# Patient Record
Sex: Female | Born: 1975 | Race: White | Hispanic: No | Marital: Married | Smoking: Never smoker
Health system: Southern US, Community
[De-identification: ages and names within clinical notes are randomized; demographics above are authoritative.]

## PROBLEM LIST (undated history)

## (undated) HISTORY — PX: ABDOMINAL HYSTERECTOMY: SHX81

## (undated) HISTORY — PX: AUGMENTATION MAMMAPLASTY: SUR837

## (undated) HISTORY — PX: CHOLECYSTECTOMY: SHX55

---

## 2001-01-03 ENCOUNTER — Encounter: Admission: RE | Admit: 2001-01-03 | Discharge: 2001-01-03 | Payer: Self-pay | Admitting: *Deleted

## 2001-01-03 ENCOUNTER — Encounter: Payer: Self-pay | Admitting: *Deleted

## 2001-08-01 ENCOUNTER — Inpatient Hospital Stay (HOSPITAL_COMMUNITY): Admission: AD | Admit: 2001-08-01 | Discharge: 2001-08-01 | Payer: Self-pay | Admitting: Obstetrics and Gynecology

## 2001-08-01 ENCOUNTER — Encounter: Payer: Self-pay | Admitting: Obstetrics and Gynecology

## 2001-12-13 ENCOUNTER — Inpatient Hospital Stay (HOSPITAL_COMMUNITY): Admission: AD | Admit: 2001-12-13 | Discharge: 2001-12-13 | Payer: Self-pay | Admitting: Obstetrics and Gynecology

## 2001-12-24 ENCOUNTER — Encounter: Payer: Self-pay | Admitting: Obstetrics and Gynecology

## 2001-12-24 ENCOUNTER — Inpatient Hospital Stay (HOSPITAL_COMMUNITY): Admission: AD | Admit: 2001-12-24 | Discharge: 2001-12-24 | Payer: Self-pay | Admitting: Obstetrics and Gynecology

## 2001-12-27 ENCOUNTER — Inpatient Hospital Stay (HOSPITAL_COMMUNITY): Admission: AD | Admit: 2001-12-27 | Discharge: 2001-12-27 | Payer: Self-pay | Admitting: Obstetrics & Gynecology

## 2001-12-29 ENCOUNTER — Observation Stay (HOSPITAL_COMMUNITY): Admission: AD | Admit: 2001-12-29 | Discharge: 2001-12-30 | Payer: Self-pay | Admitting: *Deleted

## 2002-01-09 ENCOUNTER — Inpatient Hospital Stay (HOSPITAL_COMMUNITY): Admission: AD | Admit: 2002-01-09 | Discharge: 2002-01-09 | Payer: Self-pay | Admitting: Obstetrics and Gynecology

## 2002-01-22 ENCOUNTER — Inpatient Hospital Stay (HOSPITAL_COMMUNITY): Admission: AD | Admit: 2002-01-22 | Discharge: 2002-01-22 | Payer: Self-pay | Admitting: *Deleted

## 2002-01-24 ENCOUNTER — Encounter: Payer: Self-pay | Admitting: Obstetrics & Gynecology

## 2002-01-24 ENCOUNTER — Encounter (INDEPENDENT_AMBULATORY_CARE_PROVIDER_SITE_OTHER): Payer: Self-pay

## 2002-01-24 ENCOUNTER — Inpatient Hospital Stay (HOSPITAL_COMMUNITY): Admission: AD | Admit: 2002-01-24 | Discharge: 2002-01-29 | Payer: Self-pay | Admitting: Obstetrics & Gynecology

## 2002-03-12 ENCOUNTER — Other Ambulatory Visit: Admission: RE | Admit: 2002-03-12 | Discharge: 2002-03-12 | Payer: Self-pay | Admitting: *Deleted

## 2005-02-25 ENCOUNTER — Other Ambulatory Visit: Admission: RE | Admit: 2005-02-25 | Discharge: 2005-02-25 | Payer: Self-pay | Admitting: Obstetrics and Gynecology

## 2005-03-16 ENCOUNTER — Encounter: Admission: RE | Admit: 2005-03-16 | Discharge: 2005-03-16 | Payer: Self-pay | Admitting: Obstetrics and Gynecology

## 2008-07-23 ENCOUNTER — Observation Stay (HOSPITAL_COMMUNITY): Admission: EM | Admit: 2008-07-23 | Discharge: 2008-07-24 | Payer: Self-pay | Admitting: Emergency Medicine

## 2010-07-08 LAB — CROSSMATCH
ABO/RH(D): O POS
Antibody Screen: NEGATIVE

## 2010-07-08 LAB — HEMOGLOBIN AND HEMATOCRIT, BLOOD
HCT: 26.1 % — ABNORMAL LOW (ref 36.0–46.0)
Hemoglobin: 9.1 g/dL — ABNORMAL LOW (ref 12.0–15.0)

## 2010-07-08 LAB — CBC
HCT: 32 % — ABNORMAL LOW (ref 36.0–46.0)
Hemoglobin: 11.3 g/dL — ABNORMAL LOW (ref 12.0–15.0)
MCV: 94.8 fL (ref 78.0–100.0)
RBC: 3.38 MIL/uL — ABNORMAL LOW (ref 3.87–5.11)
WBC: 19 10*3/uL — ABNORMAL HIGH (ref 4.0–10.5)

## 2010-07-08 LAB — ABO/RH: ABO/RH(D): O POS

## 2010-08-11 NOTE — Op Note (Signed)
Courtney Roberts, Courtney Roberts              ACCOUNT NO.:  [id number]   MEDICAL RECORD NO.:  [id number]          PATIENT TYPE:  INP   LOCATION:  2550                         FACILITY:  [facility]   PHYSICIAN:  Consuello Bossier., M.D.DATE OF BIRTH:  1976-02-06   DATE OF PROCEDURE:  07/23/2008  DATE OF DISCHARGE:                               OPERATIVE REPORT   PREOPERATIVE DIAGNOSIS:  Hematoma following abdominoplasty.   POSTOPERATIVE DIAGNOSIS:  Hematoma following abdominoplasty.   OPERATIONS PERFORMED:  Evacuation of hematoma, achieving of hemostasis,  and replacement of drains.   SURGEON:  Pleas Patricia, MD   ASSISTANT:  Etter Sjogren, MD   ANESTHESIA:  General endotracheal.   FINDINGS:  The patient has had an abdominoplasty and a concomitant  bilateral augmentation mammoplasty today.  Approximately 8 hours after  surgery, it had been noticed and have some increased drainage and what  appeared to be enlarging area of her abdominal area, which was felt to  be a hematoma.  Then, it was felt advisable to return to the operating  room for evacuation and achieving hemostasis.  The above surgical  procedure was carried out.   PROCEDURE:  The patient was brought to the operating room, given a  general endotracheal anesthetic, prepped with Betadine and about abdomen  draped in the sterile fashion.  Her drains had been removed, and a Foley  catheter had been placed.  The previous transverse lower abdominal  incision was opened and what appeared to be around 300-400 mL of old  clotted blood was evacuated.  It was  one small but active arterial  bleeder in the left midabdominal area for which hemostasis was achieved  with electrocautery and appeared with several other smaller vessels,  which had some oozing but nothing significantly active other than the  one small arterial bleeding.  The wound was irrigated with copious  amounts of normal saline.  There was noted to be good hemostasis.   Two  10-mm Blake drains were placed through the same previous incisions and  then laid along each side of the umbilicus.  The wound was then closed  with some cutaneous #3-0 Monocryl followed by running subcuticular #4-0  Monocryl.  Steri-Strips, Xeroform, and 4 x 8's held in place by an  abdominal binder and were applied.  The patient tolerated the procedure  well, was able to be discharged from the operating room to recovery  room, and subsequently to be admitted for overnight observation.      Consuello Bossier., M.D.  Electronically Signed     HH/MEDQ  D:  07/23/2008  T:  07/24/2008  Job:  [id number]

## 2010-08-11 NOTE — H&P (Signed)
NAME:  Courtney Roberts, Courtney Roberts              ACCOUNT NO.:  [id number]   MEDICAL RECORD NO.:  [id number]          PATIENT TYPE:  INP   LOCATION:  2550                         FACILITY:  [facility]   PHYSICIAN:  Consuello Bossier., M.D.DATE OF BIRTH:  19-Sep-1975   DATE OF ADMISSION:  07/23/2008  DATE OF DISCHARGE:                              HISTORY & PHYSICAL   HISTORY OF PRESENT ILLNESS:  This is 35 year old female underwent an  uncomplicated bilateral breast augmentation and abdominoplasty this a.m.  and approximately 8 hours or so postoperatively, she was noted to have a  tender enlarged abdominal area and what appeared to be excess drainage  in her drain tubes and it was felt that she had a hematoma that need to  be evacuated.  She was admitted for this procedure.   Past medical history and review of systems are noncontributory other  than noted above.   PERTINENT PHYSICAL EXAMINATION:  GENERAL:  She had a dressing about her  chest from her augmentation mammoplasty, which had been performed  concomitantly.  Her abdominal area was swollen as well and she had what  appeared to be some bright red blood still coming from her drain sites  entered 2 drains under vacuum suction.  HEENT:  Negative.  PULMONARY:  Negative.  CARDIAC:  Negative.  GI:  Negative.  GU:  Negative.  VITAL SIGNS:  Temperature 98.6, blood pressure 91/54, pulse 72 and  regular, and respirations 15.   HOSPITAL COURSE:  The patient and her family had been advised about the  necessity of exploring this wound, evacuating what appear to be  clinically a hematoma, and obtaining hemostasis and was admitted for  this procedure.      Consuello Bossier., M.D.  Electronically Signed     HH/MEDQ  D:  07/23/2008  T:  07/24/2008  Job:  [id number]

## 2010-08-14 NOTE — Discharge Summary (Signed)
Courtney Roberts, [name]                        ACCOUNT NO.:  [id number]   MEDICAL RECORD NO.:  [id number]                   PATIENT TYPE:  INP   LOCATION:  9105                                 FACILITY:  [facility]   PHYSICIAN:  Courtney Roberts, M.D.                DATE OF BIRTH:  Jun 11, 1975   DATE OF ADMISSION:  01/24/2002  DATE OF DISCHARGE:  01/29/2002                                 DISCHARGE SUMMARY   ADMISSION DIAGNOSES:  1. Intrauterine pregnancy at 36-4/7 weeks' gestational age with a     nonreassuring nonstress test.  2. Vasoprevia.   DISCHARGE DIAGNOSES:  1. Status post low transverse cesarean section.  2. Viable female infant.  3. Permanent sterilization.  4. Status post total abdominal hysterectomy.   PROCEDURE:  1. Repeat low transverse cesarean section.  2. Bilateral tubal ligation.  3. Lysis of adhesions.  4. Total abdominal hysterectomy.   REASON FOR ADMISSION:  Please see dictated H&P.   HOSPITAL COURSE:  The patient is a 35 year old white married female, gravida  4, para that was admitted to [facility] for a biophysical  profile after observing in office fetal monitoring strip which revealed  deceleration pattern.  Ultrasound at [facility] revealed a  placenta had subserrate lobe with vessels crossing the cervix.  The cervix  only measured 1.7 cm in length with intermittent funneling at the internal  os.  Decision was made to proceed with a repeat cesarean delivery.  The  patient was taken to the operating room where spinal anesthesia was  administered without difficulty.  A low transverse incision was made with  the delivery of a viable female infant weighing 6 pounds 14 ounces with Apgars  of 9 at one minute and 9 at five minutes.  Umbilical cord pH was 7.38.  There was great difficulty in freeing the placenta completely from the right  side of the uterus but after careful exploration it was felt that the  placenta had been  essentially removed.  Bilateral tubal ligation was  performed with lysis of adhesions.  The patient tolerated the procedure well  and was taken to the recovery room in stable condition.  While in the  recovery room, the patient began to bleed excessively.  Hemabate was given  x2.  Intravenous Pitocin was administered, but the patient had significant  continued vaginal bleeding.  Uterus was temporarily firm with massage but  bright red bleeding continued.  Based on knowledge of intraoperative  findings with placenta, decision was made to proceed with a total abdominal  hysterectomy.  The patient was taken to the operating room where general  anesthesia was administered.  A low transverse incision was undertaken  intraoperatively, and the patient received four units of packed red blood  cells.  Steri-Strips and staples were removed.  The incision was  progressively opened.  Hysterectomy was performed.  Bleeding was resolved.  The patient  tolerated the procedure well and was awakened in the operating  room and transferred to the recovery room in stable condition.  On  postoperative day #1, vital signs were stable.  Urine output was good.  Abdomen was soft.  Abdominal dressing was clean, dry and intact.  Hemoglobin  was 10.9 (status post six units of packed red blood cells).  The antibiotics  were given prophylactically.  On postoperative day #2, vital signs were  stable.  Abdomen was soft with good return of bowel function.  The  antibiotics were continued.  She was tolerating a clear liquid diet without  complaints of nausea or vomiting.  Labs revealed hemoglobin of 8.0.  Platelet count 86,000.  WBC count 20.6.  On postoperative day #3,  temperature was noted to 100.2 degrees Farenheit.  Other vital signs were  stable.  Abdomen was soft.  Abdominal dressing was removed, and the incision  was noted to be clean, dry and intact.  The labs revealed a hemoglobin of  7.0, platelet count 162,000 and  WBC count of 17.2.  On postoperative day #4,  the patient was feeling better.  Hemoglobin was up to 7.4.  She was  ambulating without complaints of dizziness.  She was tolerating a regular  diet.  Intravenous antibiotics were discontinued, and antibiotics were  changed to oral administration.  On postoperative day #5, the patient was  doing well.  Hemoglobin was 7.4.  Incision was clean, dry and intact.  Staples were removed, and the patient was discharged home.   DISCHARGE CONDITION:  Stable.   DIET:  Regular as tolerated.   ACTIVITY:  No heavy lifting, no vaginal entry.  No driving x 2 weeks.   FOLLOWUP:  The patient is to follow up in the office in one week.  She is to  call for temperature greater than 100 degrees, persistent nausea and  vomiting, heavy vaginal bleeding, and redness or drainage from the  incisional site.   DISCHARGE MEDICATIONS:  1. Percocet 5/325 mg #30 one p.o. every four to six hours p.r.n. pain.  2. Motrin 600 mg every six hours p.r.n.  3. Niferex 150 mg one p.o. daily.  4. Augmentin 875 mg one p.o. b.i.d. x 7 days.  5. Colace one p.o. daily p.r.n.     Courtney Roberts, N.P.                        Courtney Roberts, M.D.    CC/MEDQ  D:  02/18/2002  T:  02/18/2002  Job:  [id number]

## 2010-08-14 NOTE — Op Note (Signed)
NAME:  Courtney Roberts, Courtney Roberts NO.:  [id number]   MEDICAL RECORD NO.:  [id number]                   PATIENT TYPE:  INP   LOCATION:  9198                                 FACILITY:  [facility]   PHYSICIAN:  Freddy Finner, M.D.                DATE OF BIRTH:  [birth year]   DATE OF PROCEDURE:  01/24/2002  DATE OF DISCHARGE:                                 OPERATIVE REPORT   PREOPERATIVE DIAGNOSES:  1. Intrauterine pregnancy, 36-4/[redacted] weeks gestation.  2. Nonreassuring fetal heart tracing with intermittent variable     decelerations.  3. Ultrasound showing succenturiate, labyrinthine placenta with vasa previa.   POSTOPERATIVE DIAGNOSES:  1. Intrauterine pregnancy, 36-4/[redacted] weeks gestation.  2. Nonreassuring fetal heart tracing with intermittent variable     decelerations.  3. Ultrasound showing succenturiate, labyrinthine placenta with vasa previa.  4. Apparent absence of right rectus muscle.  5. Marked attenuation of the lower uterine segment.  6. Obliteration of the anterior cul-de-sac with extensive adhesions and     adhesion of omentum to anterior fundal surface.   SECONDARY DIAGNOSIS:  Multiparity, requests surgical sterilization.   OPERATION PERFORMED:  Repeat cesarean section with delivery of a viable female  infant, Apgar's of 9 and 9, cord pH of 7.38.   SECONDARY OPERATION PERFORMED:  1. Lysis of adhesions.  2. Bilateral tubal ligation.   SURGEON:  Freddy Finner, M.D.   ANESTHESIA:  Spinal.   ESTIMATED INTRAOPERATIVE BLOOD LOSS:  One-thousand cc.   INTRAOPERATIVE COMPLICATIONS:  None.   BRIEF HISTORY:  The patient was seen in the office today where a fetal  monitor strip showed decelerations.  She was sent directly to the hospital  where an ultrasound was obtained for biophysical profile.  By verbal report  the placenta was felt to have a succenturiate lobe with vessels crossing the  cervix.  The cervix was said to be dynamic measuring only 1.7 cm in  length  with intermittent funneling at the internal os.  It was elected to proceed  with repeat cesarean delivery after these findings.   DESCRIPTION OF OPERATION:  The patient was brought to the operating room,  placed under spinal anesthesia and placed in the dorsal recumbent position  with elevation of the right hip of 15 degrees.  Abdomen was prepped in the  usual fashion.  Foley cath was placed using sterile technique.  Sterile  drapes were applied.  An old lower abdominal transverse scar was sharply  excised.  The incision was carried sharply down to fascia, which was entered  sharply down to fascia, which was entered sharply and extended to the extent  of the skin incision.  On entry of the fascia the lower uterine segment  bulged into the incision.  The bladder flap was indistinguishable because of  adhesions.  The bladder appeared to be well below the thin lower uterine  segment, which was entered sharply.  It measured only approximately 2 mm in  thickness.  The incision was extended bluntly in a transverse direction.  The membranes were ruptured, fluid was clear.  A viable female was then  delivered without difficulty.   Arterial cord blood was obtained for gases.  Routine venous sample was  obtained for examination.   Placenta and other products of conception were manually removed from the  uterus.  There was great difficulty in freeing the placenta completely to  the right side of the uterus superior and inferior to the incision, but  after careful exploration it was felt that the placenta had been essentially  completely removed.  With careful sharp and blunt dissection the adhesions  anteriorly were freed.  A segment of omentum was sharply excised from the  anterior fundal surface.  It was free-tied with 0-plain tie and a small  segment of omentum removed.  This created adequate hemostasis.  After  dissecting the adhesions the uterus could be delivered through the  abdominal  incision and this was done.  The posterior fundal surface appeared to be  normal as did the superior surface.  The tubes and ovaries were normal.  The  midportion of each tube was elevated with a Babcock, doubly ligated with  free ties of 0-plain and a segment of tube excised on each side, and the  distal tips of the tubes fulgurated.  This was done after closure of the  lower uterine segment incision with a running locking 0-Monocryl suture.  There was a bleeding source on the thin lower segment to the right and  superior to the incision, which was figure-of-eight with 0-Monocryl. The  defect on the anterior fundal surface created by removing the omental  adhesion was also closed with a figure-of-eight of 0-Monocryl.   At this point hemostasis seemed to be adequate.  The uterus was placed back  within the abdominal cavity.  The abdominal closure was carried out in  layers.  There was no obvious peritoneal layer, which was separate, but  there was thin tissue consistent with posterior sheath or posterior sheath  fused with peritoneum that was used and appropriated to the left rectus  muscle for closure of the abdominal cavity.  The fascia was then closed with  running 0-PDS from angle-to-angle on either side.  The subcutaneous tissue  was closed with running 2-0 plain suture.  Skin was closed with wide skin  staples and covered with Steri-Strips.   The patient tolerated the operative procedure well.   Appropriate pack, needle and instrument counts were achieved.   She was taken to recovery in good condition.                                                 Freddy Finner, M.D.    WRN/MEDQ  D:  01/24/2002  T:  01/24/2002  Job:  [id number]

## 2010-08-14 NOTE — Op Note (Signed)
NAME:  Courtney Roberts, Courtney Roberts NO.:  [id number]   MEDICAL RECORD NO.:  [id number]                   PATIENT TYPE:  INP   LOCATION:  9198                                 FACILITY:  [facility]   PHYSICIAN:  Freddy Finner, M.D.                DATE OF BIRTH:  08-26-1975   DATE OF PROCEDURE:  01/24/2002  DATE OF DISCHARGE:                                 OPERATIVE REPORT   PREOPERATIVE DIAGNOSIS:  Postpartum hemorrhage following cesarean delivery;  this is presumed to have occurred from the site on the right noted in the  first operative note where the placenta appeared to be densely adherent to  the lower uterine segment which was extremely attenuated.   OPERATIVE PROCEDURE:  Total abdominal hysterectomy.   SURGEON:  Freddy Finner, M.D.   ANESTHESIA:  General endotracheal.   ESTIMATED BLOOD LOSS:  Estimated intraoperative blood loss during this  portion of the procedure was approximately 1000 cc.  The patient had also  probably lost another 1000 cc before beginning the procedure.  Blood  transfusion was undertaken intraoperatively.  The patient was to receive 4  units of packed red blood cells.   INDICATION FOR PROCEDURE:  The patient was initially in good postoperative  condition following her cesarean but approximately 30 minutes into her  postoperative course she had significant vaginal bleeding.  She was attended  by  Dr. Henderson Cloud in my short-term absence from the hospital and he gave her  Hemabate x2.  She had rapid infusion of IV Pitocin but continued to have  significant vaginal bleeding.  She would temporarily firm up in the recovery  room but there was a continued at least light bright red lochia consistent  with significant bleeding.  Based on the known intraoperative findings from  the previous procedure, it was elected to proceed with total abdominal  hysterectomy.   DESCRIPTION OF PROCEDURE:  The patient was brought back to the operating  room,  placed under adequate general endotracheal anesthesia, the abdomen was  prepped, the Steri-Strips and staples were removed, the incision was  progressively opened cutting the sutures rows.  On entry into the abdomen  the uterus was elevated through the abdominal incision and the dissection  was begun.  The proximal broad ligaments were clamped with large Kellys  using curved Heaneys initially, progressive pedicles were developed to  control the utero-ovarian pedicle.  A total of three pedicles were required  on each side for control of the upper broad ligament.  The dissection was  progressively taken along the side of the cervix which was palpated  regularly using straight or curved Heaneys, supportive ligament pedicles  were developed, uterine artery pedicles were developed.  Once the vessels  had been controlled on either side and after careful blunt dissection of the  bladder off the lower segment of the cervix the uterus was amputated.  The  cervix could  then be palpated digitally and with additional pedicles on each  side the remaining cardinal ligament portion of the broad ligament was  taken, each of the pedicles were divided and ligated with suture ligatures  with 0 Monocryl or 0 Vicryl.  When the angle of vagina had been reached it  was cross clamped with a curved Heaney and the cervix excised from the  uterosacral, each of these pedicles was transfixed with a suture ligature of  0 Monocryl in a Haney fashion and held, the cervix was then carefully  dissected free of the vaginal cuff.  The cuff was then closed vertically  with figure-of-eights with 0 Vicryl.  The uterosacrals were plicated with 0  Vicryl.  Copious irrigation was carried out.  Hemostasis was noted to be  complete throughout.  All packs, needles, and instruments were removed.  Counts were correct.  The abdominal incision was again closed in layers.  The attenuated peritoneum and posterior fascia was used and closed  to the  left rectus muscle.  The fascia was closed with running 0 PDS, subcutaneous  tissue was closed with running 0 plain suture, the skin was closed with  ______ skin staples and cornered Steri-Strips.  The patient tolerated the  operative procedure well, was awakened and taken to the recovery room in  satisfactory condition.                                               Freddy Finner, M.D.    WRN/MEDQ  D:  01/24/2002  T:  01/24/2002  Job:  [id number]

## 2013-07-30 ENCOUNTER — Emergency Department (HOSPITAL_COMMUNITY)
Admission: EM | Admit: 2013-07-30 | Discharge: 2013-07-31 | Disposition: A | Discharge status: Home / Self Care | Payer: 59 | Attending: Emergency Medicine | Admitting: Emergency Medicine

## 2013-07-30 ENCOUNTER — Encounter (HOSPITAL_COMMUNITY): Payer: Self-pay | Admitting: Emergency Medicine

## 2013-07-30 ENCOUNTER — Emergency Department (HOSPITAL_COMMUNITY)
Admission: EM | Admit: 2013-07-30 | Discharge: 2013-07-30 | Disposition: A | Discharge status: Home / Self Care | Payer: 59 | Source: Home / Self Care

## 2013-07-30 ENCOUNTER — Emergency Department (HOSPITAL_COMMUNITY): Payer: 59

## 2013-07-30 DIAGNOSIS — Z90710 Acquired absence of both cervix and uterus: Secondary | ICD-10-CM | POA: Diagnosis not present

## 2013-07-30 DIAGNOSIS — R110 Nausea: Secondary | ICD-10-CM

## 2013-07-30 DIAGNOSIS — K5289 Other specified noninfective gastroenteritis and colitis: Secondary | ICD-10-CM | POA: Insufficient documentation

## 2013-07-30 DIAGNOSIS — R1031 Right lower quadrant pain: Secondary | ICD-10-CM | POA: Diagnosis present

## 2013-07-30 DIAGNOSIS — Z9089 Acquired absence of other organs: Secondary | ICD-10-CM | POA: Diagnosis not present

## 2013-07-30 DIAGNOSIS — R109 Unspecified abdominal pain: Secondary | ICD-10-CM

## 2013-07-30 DIAGNOSIS — K529 Noninfective gastroenteritis and colitis, unspecified: Secondary | ICD-10-CM

## 2013-07-30 LAB — CBC WITH DIFFERENTIAL/PLATELET
BASOS ABS: 0 10*3/uL (ref 0.0–0.1)
Basophils Relative: 0 % (ref 0–1)
EOS PCT: 1 % (ref 0–5)
Eosinophils Absolute: 0.1 10*3/uL (ref 0.0–0.7)
HCT: 40.9 % (ref 36.0–46.0)
Hemoglobin: 14.1 g/dL (ref 12.0–15.0)
LYMPHS ABS: 4.1 10*3/uL — AB (ref 0.7–4.0)
LYMPHS PCT: 40 % (ref 12–46)
MCH: 33.8 pg (ref 26.0–34.0)
MCHC: 34.5 g/dL (ref 30.0–36.0)
MCV: 98.1 fL (ref 78.0–100.0)
Monocytes Absolute: 0.8 10*3/uL (ref 0.1–1.0)
Monocytes Relative: 8 % (ref 3–12)
NEUTROS ABS: 5.3 10*3/uL (ref 1.7–7.7)
Neutrophils Relative %: 51 % (ref 43–77)
PLATELETS: 213 10*3/uL (ref 150–400)
RBC: 4.17 MIL/uL (ref 3.87–5.11)
RDW: 13.5 % (ref 11.5–15.5)
WBC: 10.3 10*3/uL (ref 4.0–10.5)

## 2013-07-30 LAB — COMPREHENSIVE METABOLIC PANEL
ALK PHOS: 75 U/L (ref 39–117)
ALT: 19 U/L (ref 0–35)
AST: 17 U/L (ref 0–37)
Albumin: 3.7 g/dL (ref 3.5–5.2)
BILIRUBIN TOTAL: 0.4 mg/dL (ref 0.3–1.2)
BUN: 14 mg/dL (ref 6–23)
CHLORIDE: 104 meq/L (ref 96–112)
CO2: 28 meq/L (ref 19–32)
Calcium: 9.4 mg/dL (ref 8.4–10.5)
Creatinine, Ser: 0.77 mg/dL (ref 0.50–1.10)
GLUCOSE: 104 mg/dL — AB (ref 70–99)
POTASSIUM: 3.9 meq/L (ref 3.7–5.3)
SODIUM: 143 meq/L (ref 137–147)
Total Protein: 6.7 g/dL (ref 6.0–8.3)

## 2013-07-30 LAB — URINALYSIS, ROUTINE W REFLEX MICROSCOPIC
Bilirubin Urine: NEGATIVE
GLUCOSE, UA: NEGATIVE mg/dL
HGB URINE DIPSTICK: NEGATIVE
Ketones, ur: 15 mg/dL — AB
LEUKOCYTES UA: NEGATIVE
Nitrite: NEGATIVE
PROTEIN: NEGATIVE mg/dL
SPECIFIC GRAVITY, URINE: 1.022 (ref 1.005–1.030)
UROBILINOGEN UA: 1 mg/dL (ref 0.0–1.0)
pH: 5.5 (ref 5.0–8.0)

## 2013-07-30 LAB — LIPASE, BLOOD: Lipase: 21 U/L (ref 11–59)

## 2013-07-30 MED ORDER — HYDROMORPHONE HCL PF 1 MG/ML IJ SOLN
1.0000 mg | Freq: Once | INTRAMUSCULAR | Status: AC
  Administered 2013-07-30: 1 mg via INTRAVENOUS
  Filled 2013-07-30: qty 1

## 2013-07-30 MED ORDER — SODIUM CHLORIDE 0.9 % IV SOLN
1000.0000 mL | Freq: Once | INTRAVENOUS | Status: AC
  Administered 2013-07-30: 1000 mL via INTRAVENOUS

## 2013-07-30 MED ORDER — ONDANSETRON HCL 4 MG/2ML IJ SOLN
4.0000 mg | Freq: Once | INTRAMUSCULAR | Status: AC
  Administered 2013-07-30: 4 mg via INTRAVENOUS
  Filled 2013-07-30: qty 2

## 2013-07-30 MED ORDER — IOHEXOL 300 MG/ML  SOLN
100.0000 mL | Freq: Once | INTRAMUSCULAR | Status: AC | PRN
  Administered 2013-07-30: 100 mL via INTRAVENOUS

## 2013-07-30 MED ORDER — IOHEXOL 300 MG/ML  SOLN
50.0000 mL | Freq: Once | INTRAMUSCULAR | Status: AC | PRN
  Administered 2013-07-30: 50 mL via ORAL

## 2013-07-30 NOTE — ED Notes (Signed)
Pt complains of right lower abd pain since about 2pm today, no vomiting but very nauseated

## 2013-07-30 NOTE — ED Provider Notes (Signed)
CSN: [id number]     Arrival date & time 07/30/13  2115 History   First MD Initiated Contact with Patient 07/30/13 2206     Chief Complaint  Patient presents with  . Abdominal Pain     (Consider location/radiation/quality/duration/timing/severity/associated sxs/prior Treatment) HPI Courtney Roberts is a(n) 38 y.o. female who presents to the emergency department complaining of right lower quadrant abdominal pain. Patient states that her pain began this morning. She describes it as intermittent at that time. It is progressively worse and is now on stent, severe, she is associated severe nausea without vomiting. Patient denies any urinary symptoms patient history previous hysterectomy. She denies any vaginal symptoms. No fever, chills,diarrhea.   History reviewed. No pertinent past medical history. Past Surgical History  Procedure Laterality Date  . Abdominal hysterectomy    . Cholecystectomy     History reviewed. No pertinent family history. History  Substance Use Topics  . Smoking status: Never Smoker   . Smokeless tobacco: Not on file  . Alcohol Use: No   OB History   Grav Para Term Preterm Abortions TAB SAB Ect Mult Living                 Review of Systems  Ten systems reviewed and are negative for acute change, except as noted in the HPI.     Allergies  Review of patient's allergies indicates no known allergies.  Home Medications   Prior to Admission medications   Medication Sig Start Date End Date Taking? Authorizing Provider  ibuprofen (ADVIL,MOTRIN) 200 MG tablet Take 400 mg by mouth every 6 (six) hours as needed for moderate pain.   Yes Historical Provider, MD   BP 107/73  Pulse 74  Temp(Src) 98.3 F (36.8 C) (Oral)  Resp 16  SpO2 99% Physical Exam  Constitutional: She is oriented to person, place, and time. She appears well-developed and well-nourished. No distress.  HENT:  Head: Normocephalic and atraumatic.  Eyes: Conjunctivae are normal. No scleral  icterus.  Neck: Normal range of motion.  Cardiovascular: Normal rate, regular rhythm and normal heart sounds.  Exam reveals no gallop and no friction rub.   No murmur heard. Pulmonary/Chest: Effort normal and breath sounds normal. No respiratory distress.  Abdominal: Soft. Bowel sounds are normal. She exhibits no distension and no mass. There is tenderness in the right lower quadrant. There is rebound. There is no rigidity, no guarding and no CVA tenderness.  + rebound, + rovsing's, + obturator  Neurological: She is alert and oriented to person, place, and time.  Skin: Skin is warm and dry. She is not diaphoretic.    ED Course  Procedures (including critical care time) Labs Review Labs Reviewed  URINALYSIS, ROUTINE W REFLEX MICROSCOPIC - Abnormal; Notable for the following:    APPearance CLOUDY (*)    Ketones, ur 15 (*)    All other components within normal limits  CBC WITH DIFFERENTIAL - Abnormal; Notable for the following:    Lymphs Abs 4.1 (*)    All other components within normal limits  COMPREHENSIVE METABOLIC PANEL - Abnormal; Notable for the following:    Glucose, Bld 104 (*)    All other components within normal limits  LIPASE, BLOOD    Imaging Review Ct Abdomen Pelvis W Contrast  07/30/2013   CLINICAL DATA:  Right lower abdominal pain  EXAM: CT ABDOMEN AND PELVIS WITH CONTRAST  TECHNIQUE: Multidetector CT imaging of the abdomen and pelvis was performed using the standard protocol following bolus administration of  intravenous contrast.  CONTRAST:  51m OMNIPAQUE IOHEXOL 300 MG/ML SOLN, 1045mOMNIPAQUE IOHEXOL 300 MG/ML SOLN  COMPARISON:  None.  FINDINGS: The lung bases are clear.  The liver demonstrates no focal abnormality. There is no intrahepatic or extrahepatic biliary ductal dilatation. The gallbladder is surgically absent. The spleen demonstrates no focal abnormality. The kidneys, adrenal glands and pancreas are normal. The bladder is unremarkable.  The stomach, duodenum,  small intestine, and large intestine demonstrate no contrast extravasation or dilatation. There is mild bowel wall thickening of the proximal jejunum without bowel dilatation. Prior hysterectomy. The ovaries are unremarkable. There is no pneumoperitoneum, pneumatosis, or portal venous gas. There is no abdominal or pelvic free fluid. There is no lymphadenopathy. There is mild periportal edema.  The abdominal aorta is normal in caliber.  There are no lytic or sclerotic osseous lesions.  IMPRESSION: 1. Mild nonspecific periportal edema. 2. Mild jejunal bowel wall thickening as can be seen with enteritis secondary to an infectious or inflammatory process.   Electronically Signed   By: HeKathreen Devoid On: 07/30/2013 23:47     EKG Interpretation None      MDM   Final diagnoses:  Enteritis  Abdominal pain  Nausea   Patient wit Abdominal pain in the RLQ. Concern for appendicitis. I doubt other cause such as TOA/ Ovarian torsion. Patient is without pain at time of exam.     11:59 PM BP 107/73  Pulse 74  Temp(Src) 98.3 F (36.8 C) (Oral)  Resp 16  SpO2 99% Patient with jejunal wall thickening.  Appears to be enteritis.  Cipro, flagyl, and pain meds at discharge. Clear liquid diet and strong return precautions given.  AbMargarita MailPA-C 07/31/13 0025

## 2013-07-30 NOTE — ED Notes (Addendum)
Presents with right lower abdominal pain began at 2-3pm this afternoon and has progressively worsened. Pain is worse with urination, nothing makes pain better. Last meal at 7 pm tonight. Pain rated 8/10. Denies nausea and vomiting.  Denies vaginal discharge, dneis dysuria.

## 2013-07-31 MED ORDER — HYDROMORPHONE HCL PF 1 MG/ML IJ SOLN
1.0000 mg | Freq: Once | INTRAMUSCULAR | Status: AC
  Administered 2013-07-31: 1 mg via INTRAVENOUS
  Filled 2013-07-31: qty 1

## 2013-07-31 MED ORDER — ONDANSETRON HCL 4 MG/2ML IJ SOLN
4.0000 mg | Freq: Once | INTRAMUSCULAR | Status: AC
  Administered 2013-07-31: 4 mg via INTRAVENOUS
  Filled 2013-07-31: qty 2

## 2013-07-31 MED ORDER — CIPROFLOXACIN HCL 500 MG PO TABS: 500.0000 mg | ORAL_TABLET | Freq: Two times a day (BID) | ORAL | Status: AC

## 2013-07-31 MED ORDER — ONDANSETRON HCL 4 MG PO TABS: 4.0000 mg | ORAL_TABLET | Freq: Four times a day (QID) | ORAL | Status: AC

## 2013-07-31 MED ORDER — OXYCODONE-ACETAMINOPHEN 5-325 MG PO TABS: 1.0000 | ORAL_TABLET | Freq: Four times a day (QID) | ORAL | Status: AC | PRN

## 2013-07-31 MED ORDER — METRONIDAZOLE 500 MG PO TABS: 500.0000 mg | ORAL_TABLET | Freq: Two times a day (BID) | ORAL | Status: AC

## 2013-07-31 NOTE — Discharge Instructions (Signed)
Abdominal (belly) pain can be caused by many things. Your caregiver performed an examination and possibly ordered blood/urine tests and imaging (CT scan, x-rays, ultrasound). Many cases can be observed and treated at home after initial evaluation in the emergency department. Even though you are being discharged home, abdominal pain can be unpredictable. Therefore, you need a repeated exam if your pain does not resolve, returns, or worsens. Most patients with abdominal pain don't have to be admitted to the hospital or have surgery, but serious problems like appendicitis and gallbladder attacks can start out as nonspecific pain. Many abdominal conditions cannot be diagnosed in one visit, so follow-up evaluations are very important. SEEK IMMEDIATE MEDICAL ATTENTION IF: The pain does not go away or becomes severe.  A temperature above 101 develops.  Repeated vomiting occurs (multiple episodes).  The pain becomes localized to portions of the abdomen. The right side could possibly be appendicitis. In an adult, the left lower portion of the abdomen could be colitis or diverticulitis.  Blood is being passed in stools or vomit (bright red or black tarry stools).  Return also if you develop chest pain, difficulty breathing, dizziness or fainting, or become confused, poorly responsive, or inconsolable (young children).   Colitis Colitis is inflammation of the colon. Colitis can be a short-term or long-standing (chronic) illness. Crohn's disease and ulcerative colitis are 2 types of colitis which are chronic. They usually require lifelong treatment. CAUSES  There are many different causes of colitis, including:  Viruses.  Germs (bacteria).  Medicine reactions. SYMPTOMS   Diarrhea.  Intestinal bleeding.  Pain.  Fever.  Throwing up (vomiting).  Tiredness (fatigue).  Weight loss.  Bowel blockage. DIAGNOSIS  The diagnosis of colitis is based on examination and stool or blood tests. X-rays, CT  scan, and colonoscopy may also be needed. TREATMENT  Treatment may include:  Fluids given through the vein (intravenously).  Bowel rest (nothing to eat or drink for a period of time).  Medicine for pain and diarrhea.  Medicines (antibiotics) that kill germs.  Cortisone medicines.  Surgery. HOME CARE INSTRUCTIONS   Get plenty of rest.  Drink enough water and fluids to keep your urine clear or pale yellow.  Eat a well-balanced diet.  Call your caregiver for follow-up as recommended. SEEK IMMEDIATE MEDICAL CARE IF:   You develop chills.  You have an oral temperature above 102 F (38.9 C), not controlled by medicine.  You have extreme weakness, fainting, or dehydration.  You have repeated vomiting.  You develop severe belly (abdominal) pain or are passing bloody or tarry stools. MAKE SURE YOU:   Understand these instructions.  Will watch your condition.  Will get help right away if you are not doing well or get worse. Document Released: 04/22/2004 Document Revised: 06/07/2011 Document Reviewed: 07/18/2009 [facility] Patient Information 2014 [place], [place].   Nausea, Adult Nausea is the feeling that you have an upset stomach or have to vomit. Nausea by itself is not likely a serious concern, but it may be an early sign of more serious medical problems. As nausea gets worse, it can lead to vomiting. If vomiting develops, there is the risk of dehydration.  CAUSES   Viral infections.  Food poisoning.  Medicines.  Pregnancy.  Motion sickness.  Migraine headaches.  Emotional distress.  Severe pain from any source.  Alcohol intoxication. HOME CARE INSTRUCTIONS  Get plenty of rest.  Ask your caregiver about specific rehydration instructions.  Eat small amounts of food and sip liquids more often.  Take all medicines as told by your caregiver. SEEK MEDICAL CARE IF:  You have not improved after 2 days, or you get worse.  You have a headache. SEEK  IMMEDIATE MEDICAL CARE IF:   You have a fever.  You faint.  You keep vomiting or have blood in your vomit.  You are extremely weak or dehydrated.  You have dark or bloody stools.  You have severe chest or abdominal pain. MAKE SURE YOU:  Understand these instructions.  Will watch your condition.  Will get help right away if you are not doing well or get worse. Document Released: 04/22/2004 Document Revised: 12/08/2011 Document Reviewed: 11/25/2010 [facility] Patient Information 2014 [place], [place]. Diet The clear liquid diet consists of foods that are liquid or will become liquid at room temperature. Examples of foods allowed on a clear liquid diet include fruit juice, broth or bouillon, gelatin, or frozen ice pops. You should be able to see through the liquid. The purpose of this diet is to provide the necessary fluids, electrolytes (such as sodium and potassium), and energy to keep the body functioning during times when you are not able to consume a regular diet. A clear liquid diet should not be continued for long periods of time, as it is not nutritionally adequate.  A CLEAR LIQUID DIET MAY BE NEEDED:  When a sudden-onset (acute) condition occurs before or after surgery.   As the first step in oral feeding.   For fluid and electrolyte replacement in diarrheal diseases.   As a diet before certain medical tests are performed.  ADEQUACY The clear liquid diet is adequate only in ascorbic acid, according to the Recommended Dietary Allowances of the [employer].  CHOOSING FOODS Breads and Starches  Allowed: None are allowed.   Avoid: All are to be avoided.  Vegetables  Allowed: Strained vegetable juices.   Avoid: Any others.  Fruit  Allowed: Strained fruit juices and fruit drinks. Include 1 serving of citrus or vitamin C-enriched fruit juice daily.   Avoid: Any others.  Meat and Meat Substitutes  Allowed: None are allowed.    Avoid: All are to be avoided.  Milk Products  Allowed: None are allowed.   Avoid: All are to be avoided.  Soups and Combination Foods  Allowed: Clear bouillon, broth, or strained broth-based soups.   Avoid: Any others.  Desserts and Sweets  Allowed: Sugar, honey. High-protein gelatin. Flavored gelatin, ices, or frozen ice pops that do not contain milk.   Avoid: Any others.  Fats and Oils  Allowed: None are allowed.   Avoid: All are to be avoided.  Beverages  Allowed: Cereal beverages, coffee (regular or decaffeinated), tea, or soda at the discretion of your health care provider.   Avoid: Any others.  Condiments  Allowed: Salt.   Avoid: Any others, including pepper.  Supplements  Allowed: Liquid nutrition beverages that you can see through.   Avoid: Any others that contain lactose or fiber. SAMPLE MEAL PLAN Breakfast  4 oz (120 mL) strained orange juice.   to 1 cup (120 to 240 mL) gelatin (plain or fortified).  1 cup (240 mL) beverage (coffee or tea).  Sugar, if desired. Midmorning Snack   cup (120 mL) gelatin (plain or fortified). Lunch  1 cup (240 mL) broth or consomm.  4 oz (120 mL) strained grapefruit juice.   cup (120 mL) gelatin (plain or fortified).  1 cup (240 mL) beverage (coffee or tea).  Sugar, if desired. Midafternoon Snack   cup (  120 mL) fruit ice.   cup (120 mL) strained fruit juice. Dinner  1 cup (240 mL) broth or consomm.   cup (120 mL) cranberry juice.   cup (120 mL) flavored gelatin (plain or fortified).  1 cup (240 mL) beverage (coffee or tea).  Sugar, if desired. Evening Snack  4 oz (120 mL) strained apple juice (vitamin C-fortified).   cup (120 mL) flavored gelatin (plain or fortified). MAKE SURE YOU:  Understand these instructions.  Will watch your child's condition.  Will get help right away if your child is not doing well or gets worse. Document Released:  03/15/2005 Document Revised: 11/15/2012 Document Reviewed: 08/15/2012 [facility] Patient Information 2014 [place].

## 2013-08-01 NOTE — ED Provider Notes (Signed)
Medical screening examination/treatment/procedure(s) were performed by non-physician practitioner and as supervising physician I was immediately available for consultation/collaboration.   EKG Interpretation None        Merryl Hacker, MD 08/01/13 [phone]

## 2016-09-28 DIAGNOSIS — F4321 Adjustment disorder with depressed mood: Secondary | ICD-10-CM | POA: Diagnosis not present

## 2016-10-09 DIAGNOSIS — F4321 Adjustment disorder with depressed mood: Secondary | ICD-10-CM | POA: Diagnosis not present

## 2016-10-19 DIAGNOSIS — F4321 Adjustment disorder with depressed mood: Secondary | ICD-10-CM | POA: Diagnosis not present

## 2016-10-28 DIAGNOSIS — F4321 Adjustment disorder with depressed mood: Secondary | ICD-10-CM | POA: Diagnosis not present

## 2016-11-08 DIAGNOSIS — F4321 Adjustment disorder with depressed mood: Secondary | ICD-10-CM | POA: Diagnosis not present

## 2016-11-30 DIAGNOSIS — F4321 Adjustment disorder with depressed mood: Secondary | ICD-10-CM | POA: Diagnosis not present

## 2016-12-10 DIAGNOSIS — F4321 Adjustment disorder with depressed mood: Secondary | ICD-10-CM | POA: Diagnosis not present

## 2016-12-27 DIAGNOSIS — F4321 Adjustment disorder with depressed mood: Secondary | ICD-10-CM | POA: Diagnosis not present

## 2017-02-08 DIAGNOSIS — F4321 Adjustment disorder with depressed mood: Secondary | ICD-10-CM | POA: Diagnosis not present

## 2017-03-11 DIAGNOSIS — Z0000 Encounter for general adult medical examination without abnormal findings: Secondary | ICD-10-CM | POA: Diagnosis not present

## 2017-03-11 DIAGNOSIS — Z23 Encounter for immunization: Secondary | ICD-10-CM | POA: Diagnosis not present

## 2017-03-11 DIAGNOSIS — Z13220 Encounter for screening for lipoid disorders: Secondary | ICD-10-CM | POA: Diagnosis not present

## 2018-12-29 DIAGNOSIS — Z6825 Body mass index (BMI) 25.0-25.9, adult: Secondary | ICD-10-CM | POA: Diagnosis not present

## 2018-12-29 DIAGNOSIS — Z01419 Encounter for gynecological examination (general) (routine) without abnormal findings: Secondary | ICD-10-CM | POA: Diagnosis not present

## 2018-12-29 DIAGNOSIS — N951 Menopausal and female climacteric states: Secondary | ICD-10-CM | POA: Diagnosis not present

## 2018-12-29 DIAGNOSIS — Z1231 Encounter for screening mammogram for malignant neoplasm of breast: Secondary | ICD-10-CM | POA: Diagnosis not present

## 2019-06-28 ENCOUNTER — Ambulatory Visit: Payer: Self-pay | Attending: Internal Medicine

## 2019-06-28 DIAGNOSIS — Z23 Encounter for immunization: Secondary | ICD-10-CM

## 2019-06-28 NOTE — Progress Notes (Signed)
   Covid-19 Vaccination Clinic  Name:  Gorgeous Newlun    MRN: [id number] DOB: 12/05/1975  06/28/2019  Ms. Guttman was observed post Covid-19 immunization for 15 minutes without incident. She was provided with Vaccine Information Sheet and instruction to access the V-Safe system.   Ms. Dearman was instructed to call 911 with any severe reactions post vaccine: Marland Kitchen Difficulty breathing  . Swelling of face and throat  . A fast heartbeat  . A bad rash all over body  . Dizziness and weakness   Immunizations Administered    Name Date Dose VIS Date Route   Pfizer COVID-19 Vaccine 06/28/2019  2:20 PM 0.3 mL 03/09/2019 Intramuscular   Manufacturer: [place]   Lot: [id number]   [place]: [id number]

## 2019-07-23 ENCOUNTER — Ambulatory Visit: Payer: Self-pay | Attending: Internal Medicine

## 2019-07-23 DIAGNOSIS — Z23 Encounter for immunization: Secondary | ICD-10-CM

## 2019-07-23 NOTE — Progress Notes (Signed)
   Covid-19 Vaccination Clinic  Name:  Xela Oregel    MRN: [id number] DOB: 1975-12-17  07/23/2019  Ms. Espericueta was observed post Covid-19 immunization for 15 minutes without incident. She was provided with Vaccine Information Sheet and instruction to access the V-Safe system.   Ms. Caillier was instructed to call 911 with any severe reactions post vaccine: Marland Kitchen Difficulty breathing  . Swelling of face and throat  . A fast heartbeat  . A bad rash all over body  . Dizziness and weakness   Immunizations Administered    Name Date Dose VIS Date Route   Pfizer COVID-19 Vaccine 07/23/2019  2:35 PM 0.3 mL 05/23/2018 Intramuscular   Manufacturer: [place]   Lot: [id number]   [place]: [id number]

## 2020-05-16 ENCOUNTER — Ambulatory Visit: Payer: Self-pay | Attending: Internal Medicine

## 2020-05-16 ENCOUNTER — Other Ambulatory Visit (HOSPITAL_COMMUNITY): Payer: Self-pay | Admitting: Internal Medicine

## 2020-05-16 DIAGNOSIS — Z23 Encounter for immunization: Secondary | ICD-10-CM

## 2020-05-16 NOTE — Progress Notes (Signed)
   Covid-19 Vaccination Clinic  Name:  Courtney Roberts    MRN: [id number] DOB: 1975/06/27  05/16/2020  [name] was observed post Covid-19 immunization for 15 minutes without incident. She was provided with Vaccine Information Sheet and instruction to access the V-Safe system.   [name] was instructed to call 911 with any severe reactions post vaccine: Marland Kitchen Difficulty breathing  . Swelling of face and throat  . A fast heartbeat  . A bad rash all over body  . Dizziness and weakness   Immunizations Administered    Name Date Dose VIS Date Route   PFIZER Comrnaty(Gray TOP) Covid-19 Vaccine 05/16/2020  2:46 PM 0.3 mL 03/06/2020 Intramuscular   Manufacturer: [place]   Lot: [id number]   [place]: [phone]

## 2020-05-19 MED FILL — PFIZER-BIONT COVID-19 VAC-T: 30 | 21 days supply | Qty: 0 | Fill #0

## 2020-07-20 LAB — COLOGUARD: COLOGUARD: POSITIVE — AB

## 2020-07-23 ENCOUNTER — Other Ambulatory Visit: Payer: Self-pay

## 2020-10-06 ENCOUNTER — Other Ambulatory Visit: Payer: Self-pay

## 2021-01-20 ENCOUNTER — Other Ambulatory Visit (HOSPITAL_COMMUNITY): Payer: Self-pay

## 2021-04-01 DIAGNOSIS — R69 Illness, unspecified: Secondary | ICD-10-CM | POA: Diagnosis not present

## 2021-04-15 DIAGNOSIS — R69 Illness, unspecified: Secondary | ICD-10-CM | POA: Diagnosis not present

## 2021-05-21 DIAGNOSIS — L814 Other melanin hyperpigmentation: Secondary | ICD-10-CM | POA: Diagnosis not present

## 2021-05-21 DIAGNOSIS — L568 Other specified acute skin changes due to ultraviolet radiation: Secondary | ICD-10-CM | POA: Diagnosis not present

## 2021-05-21 DIAGNOSIS — L728 Other follicular cysts of the skin and subcutaneous tissue: Secondary | ICD-10-CM | POA: Diagnosis not present

## 2021-05-29 DIAGNOSIS — Z789 Other specified health status: Secondary | ICD-10-CM | POA: Diagnosis not present

## 2021-05-29 DIAGNOSIS — L720 Epidermal cyst: Secondary | ICD-10-CM | POA: Diagnosis not present

## 2021-07-16 DIAGNOSIS — L720 Epidermal cyst: Secondary | ICD-10-CM | POA: Diagnosis not present

## 2021-07-16 DIAGNOSIS — Z789 Other specified health status: Secondary | ICD-10-CM | POA: Diagnosis not present

## 2021-08-19 ENCOUNTER — Other Ambulatory Visit: Payer: Self-pay | Admitting: Family Medicine

## 2021-08-19 ENCOUNTER — Other Ambulatory Visit (HOSPITAL_COMMUNITY): Payer: Self-pay | Admitting: Family Medicine

## 2021-08-19 DIAGNOSIS — E7800 Pure hypercholesterolemia, unspecified: Secondary | ICD-10-CM

## 2021-09-01 ENCOUNTER — Ambulatory Visit (HOSPITAL_BASED_OUTPATIENT_CLINIC_OR_DEPARTMENT_OTHER)
Admission: RE | Admit: 2021-09-01 | Discharge: 2021-09-01 | Disposition: A | Discharge status: Home / Self Care | Payer: Self-pay | Source: Ambulatory Visit | Attending: Family Medicine | Admitting: Family Medicine

## 2021-09-01 DIAGNOSIS — E7800 Pure hypercholesterolemia, unspecified: Secondary | ICD-10-CM | POA: Insufficient documentation

## 2021-11-11 DIAGNOSIS — E7800 Pure hypercholesterolemia, unspecified: Secondary | ICD-10-CM | POA: Diagnosis not present

## 2022-01-19 DIAGNOSIS — Z1231 Encounter for screening mammogram for malignant neoplasm of breast: Secondary | ICD-10-CM | POA: Diagnosis not present

## 2022-01-19 DIAGNOSIS — Z6827 Body mass index (BMI) 27.0-27.9, adult: Secondary | ICD-10-CM | POA: Diagnosis not present

## 2022-01-19 DIAGNOSIS — Z01419 Encounter for gynecological examination (general) (routine) without abnormal findings: Secondary | ICD-10-CM | POA: Diagnosis not present

## 2022-01-19 DIAGNOSIS — Z1272 Encounter for screening for malignant neoplasm of vagina: Secondary | ICD-10-CM | POA: Diagnosis not present

## 2022-05-24 DIAGNOSIS — L814 Other melanin hyperpigmentation: Secondary | ICD-10-CM | POA: Diagnosis not present

## 2022-05-24 DIAGNOSIS — L728 Other follicular cysts of the skin and subcutaneous tissue: Secondary | ICD-10-CM | POA: Diagnosis not present

## 2022-05-24 DIAGNOSIS — D1801 Hemangioma of skin and subcutaneous tissue: Secondary | ICD-10-CM | POA: Diagnosis not present

## 2022-05-24 DIAGNOSIS — L821 Other seborrheic keratosis: Secondary | ICD-10-CM | POA: Diagnosis not present

## 2022-05-27 DIAGNOSIS — L723 Sebaceous cyst: Secondary | ICD-10-CM | POA: Diagnosis not present

## 2022-05-27 DIAGNOSIS — L728 Other follicular cysts of the skin and subcutaneous tissue: Secondary | ICD-10-CM | POA: Diagnosis not present

## 2022-05-27 DIAGNOSIS — L298 Other pruritus: Secondary | ICD-10-CM | POA: Diagnosis not present

## 2022-08-15 ENCOUNTER — Encounter (HOSPITAL_BASED_OUTPATIENT_CLINIC_OR_DEPARTMENT_OTHER): Payer: Self-pay

## 2022-08-15 ENCOUNTER — Emergency Department (HOSPITAL_BASED_OUTPATIENT_CLINIC_OR_DEPARTMENT_OTHER): Payer: 59

## 2022-08-15 ENCOUNTER — Emergency Department (HOSPITAL_BASED_OUTPATIENT_CLINIC_OR_DEPARTMENT_OTHER)
Admission: EM | Admit: 2022-08-15 | Discharge: 2022-08-15 | Disposition: A | Discharge status: Home / Self Care | Payer: 59 | Attending: Emergency Medicine | Admitting: Emergency Medicine

## 2022-08-15 DIAGNOSIS — S6992XA Unspecified injury of left wrist, hand and finger(s), initial encounter: Secondary | ICD-10-CM | POA: Diagnosis not present

## 2022-08-15 DIAGNOSIS — M79642 Pain in left hand: Secondary | ICD-10-CM

## 2022-08-15 DIAGNOSIS — W010XXA Fall on same level from slipping, tripping and stumbling without subsequent striking against object, initial encounter: Secondary | ICD-10-CM | POA: Insufficient documentation

## 2022-08-15 DIAGNOSIS — W19XXXA Unspecified fall, initial encounter: Secondary | ICD-10-CM

## 2022-08-15 DIAGNOSIS — Y93K1 Activity, walking an animal: Secondary | ICD-10-CM | POA: Insufficient documentation

## 2022-08-15 DIAGNOSIS — S60222A Contusion of left hand, initial encounter: Secondary | ICD-10-CM | POA: Insufficient documentation

## 2022-08-15 NOTE — Discharge Instructions (Signed)
Your x-ray imaging was negative for acute fracture.  Recommend RICE: Rest, ice, compression as needed, elevation of the extremity and NSAIDs for pain control

## 2022-08-15 NOTE — ED Triage Notes (Signed)
She states she tripped whilst walking her dog yesterday evening. She c/o pain, swelling and ecchymosis at left #2 m.c.p. joint area. She removes her rings in triage.

## 2022-08-15 NOTE — ED Provider Notes (Signed)
[place] EMERGENCY DEPARTMENT AT [facility] Provider Note   CSN: [id number] Arrival date & time: 08/15/22  1002     History  Chief Complaint  Patient presents with   Hand Injury    Courtney Roberts is a 47 y.o. female.   Hand Injury    47 year old female presenting to the emergency department with hand pain.  The patient complains of pain, swelling and ecchymosis about the second MCP joint on the left.  She states that she tripped and fell while walking her dog.  She stated a small abrasion to the dorsum of the hand.  Her tetanus is up-to-date.  She endorses pain and swelling and wanted to make sure she did not have a broken bone.  Home Medications Prior to Admission medications   Medication Sig Start Date End Date Taking? Authorizing Provider  ciprofloxacin (CIPRO) 500 MG tablet Take 1 tablet (500 mg total) by mouth 2 (two) times daily. One po bid x 7 days 07/31/13   Arthor Captain, PA-C  ibuprofen (ADVIL,MOTRIN) 200 MG tablet Take 400 mg by mouth every 6 (six) hours as needed for moderate pain.    [provider]  metroNIDAZOLE (FLAGYL) 500 MG tablet Take 1 tablet (500 mg total) by mouth 2 (two) times daily. 07/31/13   Harris, Abigail, PA-C  ondansetron (ZOFRAN) 4 MG tablet Take 1 tablet (4 mg total) by mouth every 6 (six) hours. 07/31/13   Arthor Captain, PA-C  oxyCODONE-acetaminophen (PERCOCET) 5-325 MG per tablet Take 1 tablet by mouth every 6 (six) hours as needed. 07/31/13   Arthor Captain, PA-C      Allergies    Patient has no known allergies.    Review of Systems   Review of Systems  All other systems reviewed and are negative.   Physical Exam Updated Vital Signs BP (!) 162/96 (BP Location: Right Arm)   Pulse 77   Temp 98.6 F (37 C) (Oral)   Resp 16   SpO2 100%  Physical Exam Vitals and nursing note reviewed.  Constitutional:      General: She is not in acute distress. HENT:     Head: Normocephalic and atraumatic.  Eyes:      Conjunctiva/sclera: Conjunctivae normal.     Pupils: Pupils are equal, round, and reactive to light.  Cardiovascular:     Rate and Rhythm: Normal rate and regular rhythm.  Pulmonary:     Effort: Pulmonary effort is normal. No respiratory distress.  Abdominal:     General: There is no distension.     Tenderness: There is no guarding.  Musculoskeletal:        General: Swelling and tenderness present. No deformity.     Cervical back: Neck supple.     Comments: Bruising and tenderness to palpation about the second MCP joint and second metacarpal on the left, distally neurovascularly intact with 2+ radial pulses, intact motor function along the median, ulnar, radial nerve distributions. No anatomic snuffbox TTP  Skin:    Findings: No lesion or rash.  Neurological:     General: No focal deficit present.     Mental Status: She is alert. Mental status is at baseline.     ED Results / Procedures / Treatments   Labs (all labs ordered are listed, but only abnormal results are displayed) Labs Reviewed - No data to display  EKG None  Radiology DG Hand Complete Left  Result Date: 08/15/2022 CLINICAL DATA:  Left hand pain, injury EXAM: LEFT HAND - COMPLETE 3+  VIEW COMPARISON:  None Available. FINDINGS: There is no evidence of fracture or dislocation. Minimal degenerative changes, most notably at the first [facility] joint. Subchondral cysts or intraosseous ganglia within the lunate. Soft tissues are unremarkable. IMPRESSION: Negative. Electronically Signed   By: Duanne Guess D.O.   On: 08/15/2022 10:43    Procedures Procedures    Medications Ordered in ED Medications - No data to display  ED Course/ Medical Decision Making/ A&P                             Medical Decision Making Amount and/or Complexity of Data Reviewed Radiology: ordered.    47 year old female presenting to the emergency department with hand pain.  The patient complains of pain, swelling and ecchymosis about the  second MCP joint on the left.  She states that she tripped and fell while walking her dog.  She stated a small abrasion to the dorsum of the hand.  Her tetanus is up-to-date.  She endorses pain and swelling and wanted to make sure she did not have a broken bone.  On arrival, the patient was neurovascular intact, presenting with tenderness to palpation about the second metacarpal on the left and second MCP joint.  X-ray imaging was performed which was negative for fracture.  Patient was advised RICE, rest, ice, elevation, NSAIDs for pain control.  Stable for discharge.   Final Clinical Impression(s) / ED Diagnoses Final diagnoses:  Fall, initial encounter  Pain of left hand    Rx / DC Orders ED Discharge Orders     None         Ernie Avena, MD 08/15/22 1218

## 2022-08-15 NOTE — ED Notes (Signed)
Pt not in the room... Pt did not receive the discharge paperwork... Belief is that Pt left before discharge paperwork could be given and discharge V/S could be obtained.Marland KitchenMarland Kitchen

## 2022-10-29 DIAGNOSIS — E7800 Pure hypercholesterolemia, unspecified: Secondary | ICD-10-CM | POA: Diagnosis not present

## 2022-10-29 DIAGNOSIS — Z0000 Encounter for general adult medical examination without abnormal findings: Secondary | ICD-10-CM | POA: Diagnosis not present

## 2022-10-29 DIAGNOSIS — B009 Herpesviral infection, unspecified: Secondary | ICD-10-CM | POA: Diagnosis not present

## 2022-11-03 DIAGNOSIS — Z131 Encounter for screening for diabetes mellitus: Secondary | ICD-10-CM | POA: Diagnosis not present

## 2022-11-03 DIAGNOSIS — E7800 Pure hypercholesterolemia, unspecified: Secondary | ICD-10-CM | POA: Diagnosis not present

## 2023-01-24 DIAGNOSIS — Z6825 Body mass index (BMI) 25.0-25.9, adult: Secondary | ICD-10-CM | POA: Diagnosis not present

## 2023-01-24 DIAGNOSIS — Z01419 Encounter for gynecological examination (general) (routine) without abnormal findings: Secondary | ICD-10-CM | POA: Diagnosis not present

## 2023-01-24 DIAGNOSIS — Z1231 Encounter for screening mammogram for malignant neoplasm of breast: Secondary | ICD-10-CM | POA: Diagnosis not present

## 2023-05-25 DIAGNOSIS — L814 Other melanin hyperpigmentation: Secondary | ICD-10-CM | POA: Diagnosis not present

## 2023-05-25 DIAGNOSIS — D225 Melanocytic nevi of trunk: Secondary | ICD-10-CM | POA: Diagnosis not present

## 2023-05-25 DIAGNOSIS — L821 Other seborrheic keratosis: Secondary | ICD-10-CM | POA: Diagnosis not present

## 2023-06-30 IMAGING — CT CT CARDIAC CORONARY ARTERY CALCIUM SCORE
3 series · 14 of 20 positions shown, 16 images · non-contrast
Comparison: None Available.
COMPARISON: None Available.

Addendum:
EXAM:
OVER-READ INTERPRETATION  CT CHEST

The following report is an over-read performed by radiologist Dr.
over-read does not include interpretation of cardiac or coronary
anatomy or pathology. The coronary calcium score interpretation by
the cardiologist is attached.
CLINICAL DATA: 46F for cardiovascular disease risk stratification
Coronary Calcium Score
TECHNIQUE: A gated, non-contrast computed tomography scan of the heart was
performed using 3mm slice thickness. Axial images were analyzed on a
dedicated workstation. Calcium scoring of the coronary arteries was
performed using the Agatston method.

[Series 2: ax lung · axial · 0.76mm/px · z∈[-13,+77]mm · 5 of 69 slices shown]
[im 12/69  lung]
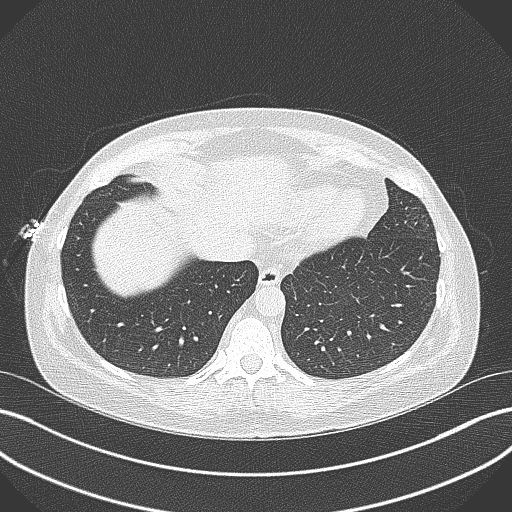
[im 23/69  lung]
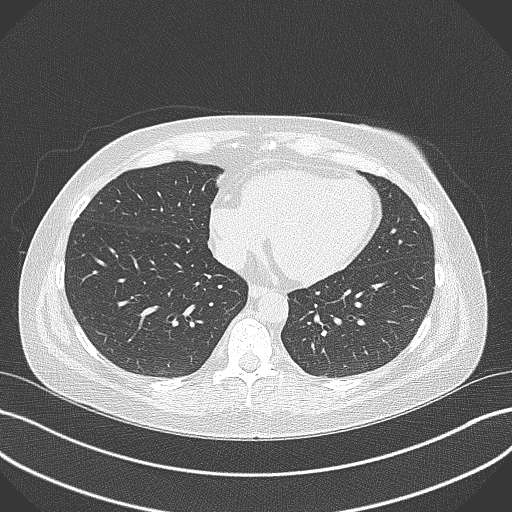
[im 35/69  lung]
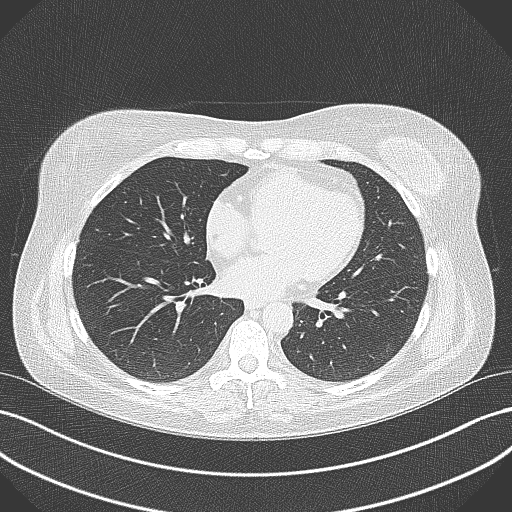
[im 46/69  lung]
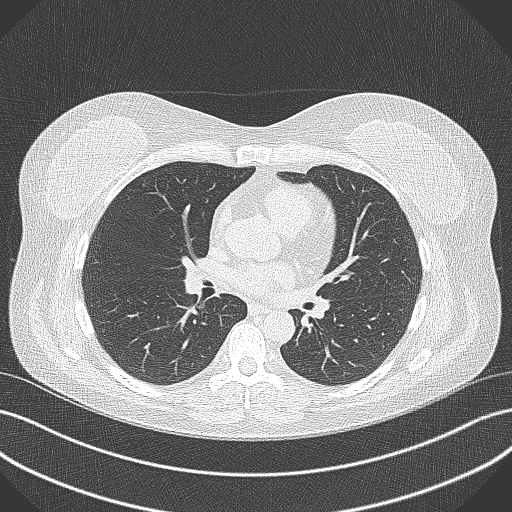
[im 57/69  lung]
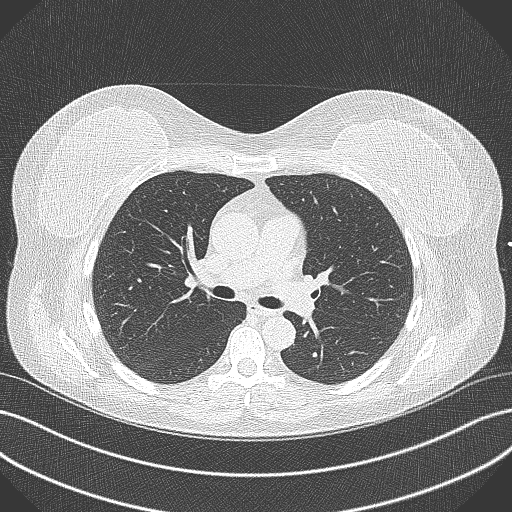

[Series 3: cascseq 3.0 sa36 70% (id) · axial · 0.39mm/px · z∈[-1,+65]mm · 3 of 46 slices shown]
[im 12/46  vessel]
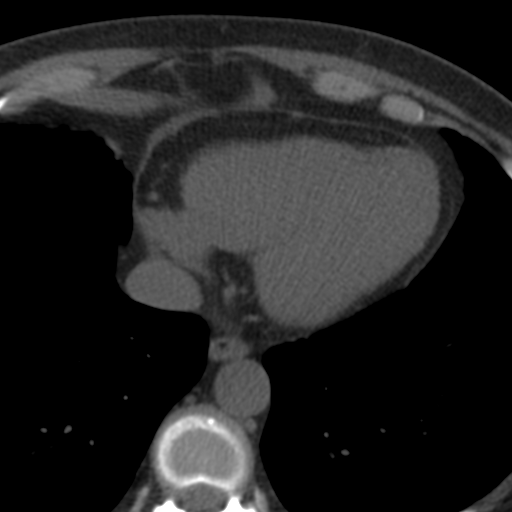
[im 23/46  vessel]
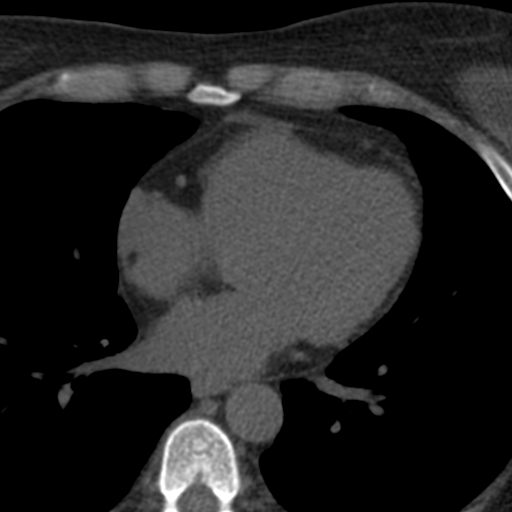
[im 34/46  vessel]
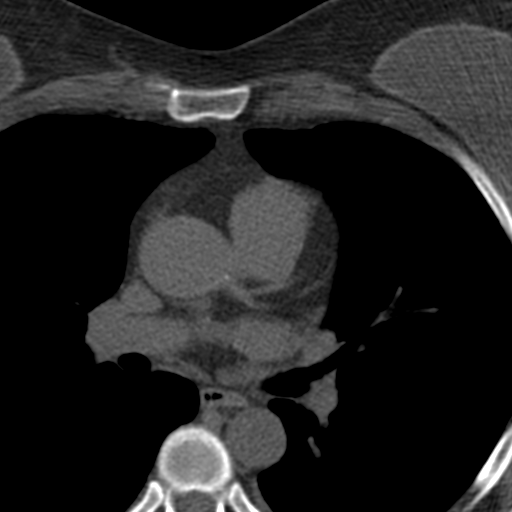

[Series 4: ax st · axial · 0.76mm/px · z∈[-17,+81]mm · 6 of 69 slices shown, 8 images]
[im 10/69  vessel]
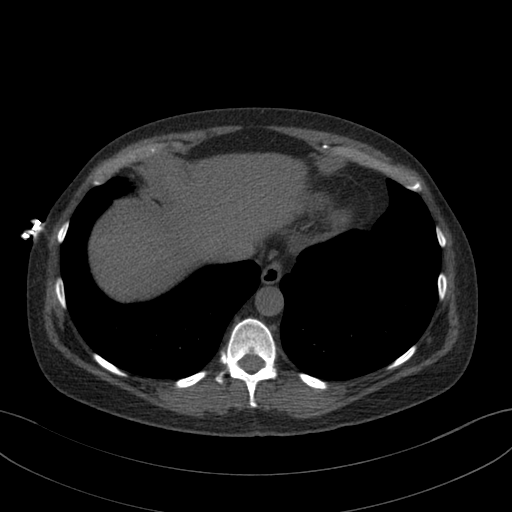
[im 10/69  lung]
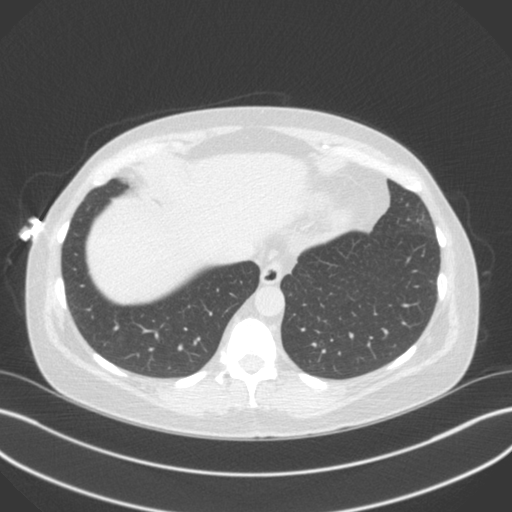
[im 20/69  vessel]
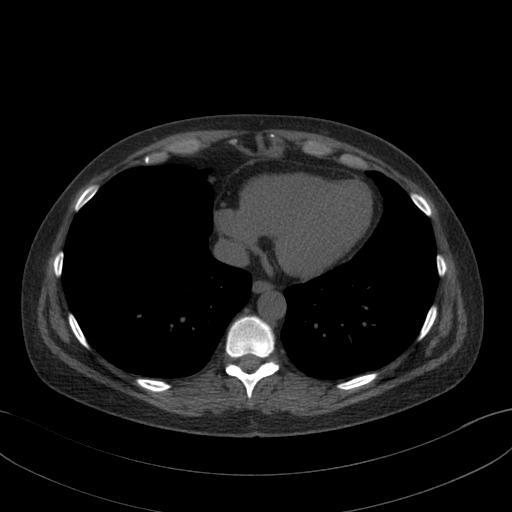
[im 30/69  vessel]
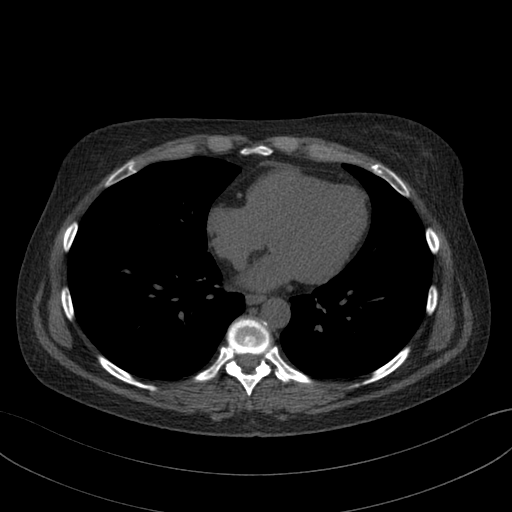
[im 39/69  vessel]
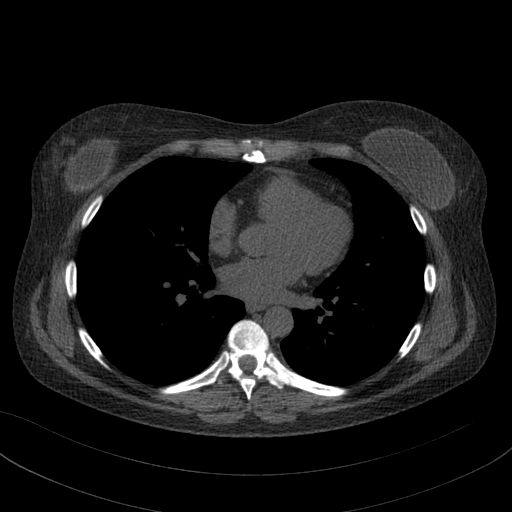
[im 49/69  vessel]
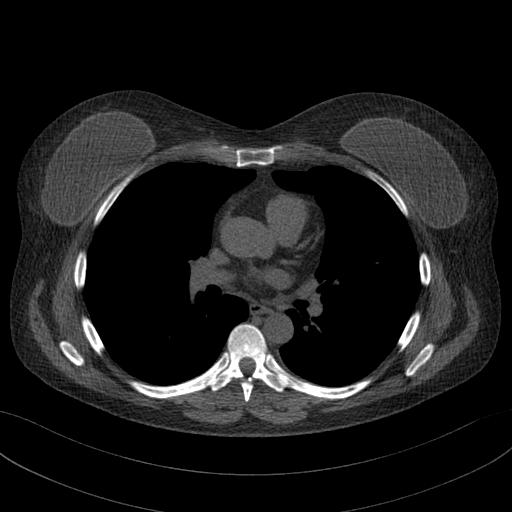
[im 49/69  lung]
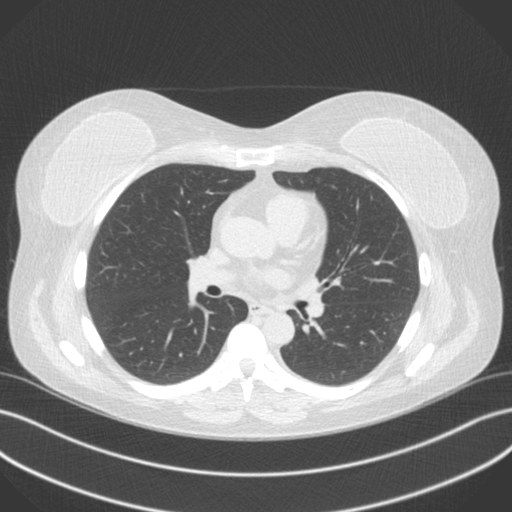
[im 59/69  vessel]
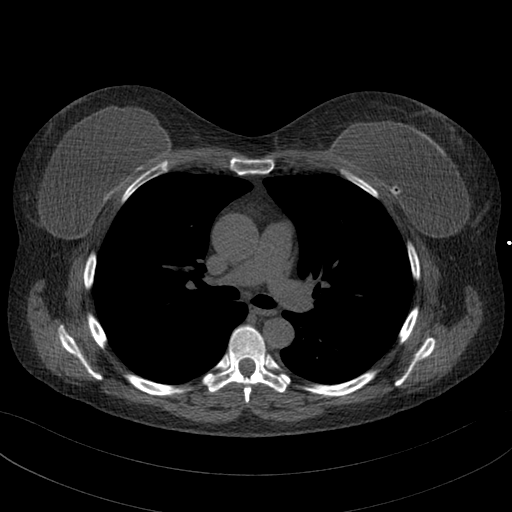

[14 of 20 positions shown; findings below may reference images not displayed]

FINDINGS: Vascular: No significant noncardiac vascular findings.

Mediastinum/Nodes: Visualized mediastinum and hilar regions
demonstrate no lymphadenopathy or masses.

Lungs/Pleura: Visualized lungs show no evidence of pulmonary edema,
consolidation, pneumothorax, nodule or pleural fluid.

Upper Abdomen: No acute abnormality.

Musculoskeletal: No chest wall mass or suspicious bone lesions
identified.
IMPRESSION: No significant incidental findings.
FINDINGS: Coronary arteries: Normal origins.

Coronary Calcium Score: 0

Pericardium: Normal.

Ascending Aorta: Normal caliber. Mild aortic atherosclerosis of the
aortic root.

Non-cardiac: See separate report from [REDACTED].
IMPRESSION: Coronary calcium score of 0. This was 0 percentile for age-, race-,
and sex-matched controls.

Mild aortic atherosclerosis of the aortic root.



If CAC=0, it is reasonable to withhold statin therapy and reassess
in 5 to 10 years, as long as higher risk conditions are absent
(diabetes mellitus, family history of premature CHD in first degree
relatives (males <55 years; females <65 years), cigarette smoking,
or LDL >=190 mg/dL).

If CAC is 1 to 99, it is reasonable to initiate statin therapy for
patients >=55 years of age.

If CAC is >=100 or >=75th percentile, it is reasonable to initiate
statin therapy at any age.

Cardiology referral should be considered for patients with CAC
scores >=400 or >=75th percentile.

*6050 AHA/ACC/AACVPR/AAPA/ABC/LAU LAU/OLVERA/SON/Nandinha/ADITI/SANTINA/GRUJO
Guideline on the Management of Blood Cholesterol: A Report of the
American College of Cardiology/American Heart Association Task Force
on Clinical Practice Guidelines. J Am Coll Cardiol.
4277;73(24):6022-6547.

*** End of Addendum ***
EXAM:
OVER-READ INTERPRETATION  CT CHEST

The following report is an over-read performed by radiologist Dr.
over-read does not include interpretation of cardiac or coronary
anatomy or pathology. The coronary calcium score interpretation by
the cardiologist is attached.
FINDINGS: Vascular: No significant noncardiac vascular findings.

Mediastinum/Nodes: Visualized mediastinum and hilar regions
demonstrate no lymphadenopathy or masses.

Lungs/Pleura: Visualized lungs show no evidence of pulmonary edema,
consolidation, pneumothorax, nodule or pleural fluid.

Upper Abdomen: No acute abnormality.

Musculoskeletal: No chest wall mass or suspicious bone lesions
identified.
IMPRESSION: No significant incidental findings.

## 2023-09-20 DIAGNOSIS — Z09 Encounter for follow-up examination after completed treatment for conditions other than malignant neoplasm: Secondary | ICD-10-CM | POA: Diagnosis not present

## 2023-09-20 DIAGNOSIS — Z860101 Personal history of adenomatous and serrated colon polyps: Secondary | ICD-10-CM | POA: Diagnosis not present

## 2023-09-20 DIAGNOSIS — K635 Polyp of colon: Secondary | ICD-10-CM | POA: Diagnosis not present

## 2023-09-20 DIAGNOSIS — K5730 Diverticulosis of large intestine without perforation or abscess without bleeding: Secondary | ICD-10-CM | POA: Diagnosis not present

## 2023-12-07 DIAGNOSIS — Z91030 Bee allergy status: Secondary | ICD-10-CM | POA: Diagnosis not present

## 2023-12-07 DIAGNOSIS — Z23 Encounter for immunization: Secondary | ICD-10-CM | POA: Diagnosis not present

## 2023-12-07 DIAGNOSIS — Z0000 Encounter for general adult medical examination without abnormal findings: Secondary | ICD-10-CM | POA: Diagnosis not present

## 2024-01-18 ENCOUNTER — Other Ambulatory Visit: Payer: Self-pay | Admitting: Family

## 2024-01-18 DIAGNOSIS — N6310 Unspecified lump in the right breast, unspecified quadrant: Secondary | ICD-10-CM

## 2024-01-18 DIAGNOSIS — N6311 Unspecified lump in the right breast, upper outer quadrant: Secondary | ICD-10-CM | POA: Diagnosis not present

## 2024-02-01 ENCOUNTER — Other Ambulatory Visit: Payer: Self-pay | Admitting: Medical Genetics

## 2024-02-07 ENCOUNTER — Ambulatory Visit
Admission: RE | Admit: 2024-02-07 | Discharge: 2024-02-07 | Disposition: A | Discharge status: Home / Self Care | Source: Ambulatory Visit | Attending: Family | Admitting: Family

## 2024-02-07 DIAGNOSIS — N6310 Unspecified lump in the right breast, unspecified quadrant: Secondary | ICD-10-CM

## 2024-02-07 DIAGNOSIS — N6489 Other specified disorders of breast: Secondary | ICD-10-CM | POA: Diagnosis not present

## 2024-02-07 DIAGNOSIS — R928 Other abnormal and inconclusive findings on diagnostic imaging of breast: Secondary | ICD-10-CM | POA: Diagnosis not present

## 2024-03-13 ENCOUNTER — Other Ambulatory Visit
